# Patient Record
Sex: Female | Born: 1960 | Race: Black or African American | Hispanic: No | Marital: Single
Health system: Midwestern US, Community
[De-identification: ages and names within clinical notes are randomized; demographics above are authoritative.]

## PROBLEM LIST (undated history)

## (undated) DIAGNOSIS — D649 Anemia, unspecified: Secondary | ICD-10-CM

## (undated) DIAGNOSIS — J45909 Unspecified asthma, uncomplicated: Secondary | ICD-10-CM

## (undated) DIAGNOSIS — K8020 Calculus of gallbladder without cholecystitis without obstruction: Secondary | ICD-10-CM

## (undated) DIAGNOSIS — N289 Disorder of kidney and ureter, unspecified: Secondary | ICD-10-CM

## (undated) DIAGNOSIS — I10 Essential (primary) hypertension: Secondary | ICD-10-CM

## (undated) HISTORY — PX: ECTOPIC PREGNANCY SURGERY: SHX613

## (undated) HISTORY — DX: Unspecified asthma, uncomplicated: J45.909

---

## 2014-12-20 ENCOUNTER — Inpatient Hospital Stay
Admit: 2014-12-20 | Discharge: 2014-12-20 | Disposition: A | Discharge status: Home / Self Care | Payer: Self-pay | Attending: Emergency Medicine

## 2014-12-20 DIAGNOSIS — R300 Dysuria: Secondary | ICD-10-CM

## 2014-12-20 LAB — URINE MICROSCOPIC ONLY: RBC: UNDETERMINED /hpf (ref 0–5)

## 2014-12-20 LAB — URINALYSIS W/ RFLX MICROSCOPIC
Bilirubin: NEGATIVE
Glucose: NEGATIVE mg/dL
Ketone: NEGATIVE mg/dL
Nitrites: POSITIVE — AB
Protein: 30 mg/dL — AB
Specific gravity: 1.017 (ref 1.005–1.030)
Urobilinogen: 0.2 EU/dL (ref 0.2–1.0)
pH (UA): 8 (ref 5.0–8.0)

## 2014-12-20 MED ORDER — AMOXICILLIN-CLAVULANATE 500 MG-125 MG TAB
500-125 mg | ORAL_TABLET | Freq: Two times a day (BID) | ORAL | 0 refills | Status: AC
Start: 2014-12-20 — End: 2014-12-25

## 2014-12-20 NOTE — ED Triage Notes (Signed)
Pt states I have a urinary tract infection.  Pt states she has had this her entire life.  Pt states she recently go antibiotics and pt states the antibiotics were not right.  Pt c/o frequent urination.

## 2014-12-20 NOTE — ED Notes (Signed)
Life Coach Interventions:       Life Coach Consult: ???RX assistance???   # of Life Coach Consult request within past 12 months:0  PCP Verified by CM: N/A  Current Support Network: Family  PCP/Family Physician referral and/or ER follow-up:  Y  Investment banker, corporate Assistance referral:   N  Plan discussed with Pt/Family/Caregiver:  Y  Miscellaneous referral: N    Memo of interventions:  Pharmacist, community conducted to assist PT with ???RX assistance???Patient resides in [place]. PT provided with Good RX discount coupon for  amoxicillin-clavulanate (AUGMENTIN) 500-125 mg  10 tabs @ $12.23 ([facility])

## 2014-12-20 NOTE — ED Provider Notes (Signed)
HPI Comments: 54 year old female to the ED with C/O urinary frequency, dysuria, and foul smelling urine times several days.  States she frequently gets UTIs and thinks she has another.  States that she typically takes PCN for them with relief.  Denies any other complaints today.     The history is provided by the patient.        History reviewed. No pertinent past medical history.    History reviewed. No pertinent past surgical history.      History reviewed. No pertinent family history.    Social History     Social History   ??? Marital status: SINGLE     Spouse name: N/A   ??? Number of children: N/A   ??? Years of education: N/A     Occupational History   ??? Not on file.     Social History Main Topics   ??? Smoking status: Never Smoker   ??? Smokeless tobacco: Not on file   ??? Alcohol use No   ??? Drug use: No   ??? Sexual activity: Not on file     Other Topics Concern   ??? Not on file     Social History Narrative   ??? No narrative on file         ALLERGIES: Review of patient's allergies indicates no known allergies.    Review of Systems   Constitutional: Negative for chills and fever.   HENT: Negative.    Respiratory: Negative for chest tightness, shortness of breath and wheezing.    Cardiovascular: Negative for chest pain and palpitations.   Gastrointestinal: Negative for abdominal pain, diarrhea, nausea and vomiting.   Genitourinary: Positive for dysuria and frequency. Negative for flank pain.   Musculoskeletal: Negative.    Skin: Negative.    Neurological: Negative.        Vitals:    12/20/14 1113   BP: 142/87   Pulse: 64   Resp: 16   Temp: 97.6 ??F (36.4 ??C)   SpO2: 99%   Weight: 64 kg (141 lb)   Height:  (1.626 m)            Physical Exam   Constitutional: She is oriented to person, place, and time. She appears well-developed and well-nourished. No distress.   HENT:   Head: Normocephalic and atraumatic.   Eyes: EOM are normal. Pupils are equal, round, and reactive to light.    Neck: Neck supple. No tracheal deviation present.   Cardiovascular: Normal rate, regular rhythm and normal heart sounds.  Exam reveals no gallop and no friction rub.    No murmur heard.  Pulmonary/Chest: Effort normal and breath sounds normal. No respiratory distress. She has no wheezes. She has no rales.   Abdominal: Soft. Bowel sounds are normal. She exhibits no distension and no mass. There is no tenderness. There is no rebound and no guarding.   No CVAT   Lymphadenopathy:     She has no cervical adenopathy.   Neurological: She is alert and oriented to person, place, and time. No cranial nerve deficit.   Skin: Skin is dry. No rash noted. She is not diaphoretic. No erythema. No pallor.   Psychiatric: She has a normal mood and affect. Her behavior is normal.   Nursing note and vitals reviewed.       MDM  Number of Diagnoses or Management Options  Diagnosis management comments: Called lab to find out about lab results.  They state no order, despite order has  been in for 1 hour.  Reordered again.  Gifford Shave, PA 12:39 PM    Patient does not want to wait for lab to run her UA.  Plan to discharge with Augmentin, give her urology andPCM follow up. SHe agrees with the plan.  Gifford Shave, PA 12:44 PM           Amount and/or Complexity of Data Reviewed  Clinical lab tests: ordered    Risk of Complications, Morbidity, and/or Mortality  Presenting problems: low  Diagnostic procedures: low  Management options: low    Patient Progress  Patient progress: stable    ED Course       Procedures

## 2014-12-20 NOTE — ED Notes (Signed)
I have reviewed discharge instructions with the patient.  The patient verbalized understanding.

## 2015-03-25 ENCOUNTER — Inpatient Hospital Stay
Admit: 2015-03-25 | Discharge: 2015-03-25 | Disposition: A | Discharge status: Home / Self Care | Payer: Self-pay | Attending: Emergency Medicine

## 2015-03-25 DIAGNOSIS — N3000 Acute cystitis without hematuria: Secondary | ICD-10-CM

## 2015-03-25 LAB — URINALYSIS W/ RFLX MICROSCOPIC
Bilirubin: NEGATIVE
Glucose: NEGATIVE mg/dL
Ketone: NEGATIVE mg/dL
Nitrites: POSITIVE — AB
Protein: NEGATIVE mg/dL
Specific gravity: 1.014 (ref 1.005–1.030)
Urobilinogen: 0.2 EU/dL (ref 0.2–1.0)
pH (UA): 6 (ref 5.0–8.0)

## 2015-03-25 LAB — POC CHEM8
Anion gap, POC: 19 (ref 10–20)
BUN, POC: 15 MG/DL (ref 7–18)
CO2, POC: 27 MMOL/L — ABNORMAL HIGH (ref 19–24)
Calcium, ionized (POC): 1.16 MMOL/L (ref 1.12–1.32)
Chloride, POC: 104 MMOL/L (ref 100–108)
Creatinine, POC: 0.8 MG/DL (ref 0.6–1.3)
GFRAA, POC: >60 mL/min/{1.73_m2} (ref >60)
GFRNA, POC: >60 mL/min/{1.73_m2} (ref >60)
Glucose, POC: 86 MG/DL (ref 74–106)
Hematocrit, POC: 42 % (ref 36–49)
Hemoglobin, POC: 14.3 G/DL (ref 12–16)
Potassium, POC: 4 MMOL/L (ref 3.5–5.5)
Sodium, POC: 145 MMOL/L (ref 136–145)

## 2015-03-25 LAB — URINE MICROSCOPIC ONLY
RBC: 1 /hpf (ref 0–5)
WBC: 3 /hpf (ref 0–4)

## 2015-03-25 LAB — WET PREP

## 2015-03-25 MED ORDER — CIPROFLOXACIN 500 MG TAB
500 mg | ORAL_TABLET | Freq: Two times a day (BID) | ORAL | 0 refills | Status: AC
Start: 2015-03-25 — End: 2015-03-30

## 2015-03-25 NOTE — ED Triage Notes (Signed)
Pt c/o vaginal pain during sex, bl le and ue swelling, pt denies vaginal odor or discharge, no s/s of cardiac or respiratory distress, pt ambulatory strong steady gait
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
## 2015-03-25 NOTE — ED Notes (Signed)
Discharge instructions discussed with patient. Patient verbalizes understanding

## 2015-03-27 LAB — CULTURE, URINE: Culture result:: >100000

## 2015-03-27 LAB — CHLAMYDIA/NEISSERIA AMPLIFICATION
Chlamydia amplification: NEGATIVE
N. gonorrhoeae amplification: NEGATIVE

## 2015-07-05 ENCOUNTER — Emergency Department: Admit: 2015-07-05 | Payer: Self-pay | Primary: Student in an Organized Health Care Education/Training Program

## 2015-07-05 ENCOUNTER — Inpatient Hospital Stay
Admit: 2015-07-05 | Discharge: 2015-07-05 | Disposition: A | Discharge status: Home / Self Care | Payer: Self-pay | Attending: Emergency Medicine

## 2015-07-05 DIAGNOSIS — I10 Essential (primary) hypertension: Secondary | ICD-10-CM

## 2015-07-05 LAB — CBC WITH AUTOMATED DIFF
ABS. BASOPHILS: 0.1 10*3/uL (ref 0.0–0.1)
ABS. EOSINOPHILS: 0.2 10*3/uL (ref 0.0–0.4)
ABS. LYMPHOCYTES: 1.9 10*3/uL (ref 0.9–3.6)
ABS. MONOCYTES: 0.3 10*3/uL (ref 0.05–1.2)
ABS. NEUTROPHILS: 3 10*3/uL (ref 1.8–8.0)
BASOPHILS: 1 % (ref 0–2)
EOSINOPHILS: 3 % (ref 0–5)
HCT: 38.4 % (ref 35.0–45.0)
HGB: 12.7 g/dL (ref 12.0–16.0)
LYMPHOCYTES: 35 % (ref 21–52)
MCH: 27.1 PG (ref 24.0–34.0)
MCHC: 33.1 g/dL (ref 31.0–37.0)
MCV: 81.9 FL (ref 74.0–97.0)
MONOCYTES: 5 % (ref 3–10)
MPV: 10.6 FL (ref 9.2–11.8)
NEUTROPHILS: 56 % (ref 40–73)
PLATELET: 249 10*3/uL (ref 135–420)
RBC: 4.69 M/uL (ref 4.20–5.30)
RDW: 14.4 % (ref 11.6–14.5)
WBC: 5.4 10*3/uL (ref 4.6–13.2)

## 2015-07-05 LAB — URINE MICROSCOPIC ONLY
RBC: 2 /hpf (ref 0–5)
WBC: 0 /hpf (ref 0–4)

## 2015-07-05 LAB — URINALYSIS W/ RFLX MICROSCOPIC
Bilirubin: NEGATIVE
Glucose: NEGATIVE mg/dL
Leukocyte Esterase: NEGATIVE
Nitrites: NEGATIVE
Protein: NEGATIVE mg/dL
Specific gravity: 1.029 (ref 1.005–1.030)
Urobilinogen: 1 EU/dL (ref 0.2–1.0)
pH (UA): 5 (ref 5.0–8.0)

## 2015-07-05 LAB — METABOLIC PANEL, COMPREHENSIVE
A-G Ratio: 0.9 (ref 0.8–1.7)
ALT (SGPT): 27 U/L (ref 13–56)
AST (SGOT): 19 U/L (ref 15–37)
Albumin: 3.7 g/dL (ref 3.4–5.0)
Alk. phosphatase: 84 U/L (ref 45–117)
Anion gap: 4 mmol/L (ref 3.0–18)
BUN/Creatinine ratio: 20 (ref 12–20)
BUN: 17 MG/DL (ref 7.0–18)
Bilirubin, total: 0.3 MG/DL (ref 0.2–1.0)
CO2: 28 mmol/L (ref 21–32)
Calcium: 8.7 MG/DL (ref 8.5–10.1)
Chloride: 109 mmol/L — ABNORMAL HIGH (ref 100–108)
Creatinine: 0.85 MG/DL (ref 0.6–1.3)
GFR est AA: >60 mL/min/{1.73_m2} (ref >60)
GFR est non-AA: >60 mL/min/{1.73_m2} (ref >60)
Globulin: 4 g/dL (ref 2.0–4.0)
Glucose: 90 mg/dL (ref 74–99)
Potassium: 3.5 mmol/L (ref 3.5–5.5)
Protein, total: 7.7 g/dL (ref 6.4–8.2)
Sodium: 141 mmol/L (ref 136–145)

## 2015-07-05 LAB — CARDIAC PANEL,(CK, CKMB & TROPONIN)
CK - MB: <1 ng/ml (ref <3.6)
CK: 167 U/L (ref 26–192)
Troponin-I, QT: <0.02 NG/ML (ref 0.0–0.045)

## 2015-07-05 LAB — LIPASE: Lipase: 99 U/L (ref 73–393)

## 2015-07-05 LAB — MAGNESIUM: Magnesium: 2.2 mg/dL (ref 1.6–2.6)

## 2015-07-05 MED ORDER — ONDANSETRON 8 MG TAB, RAPID DISSOLVE
8 mg | ORAL | Status: DC
Start: 2015-07-05 — End: 2015-07-05

## 2015-07-05 MED ORDER — METOCLOPRAMIDE 5 MG/ML IJ SOLN
5 mg/mL | INTRAMUSCULAR | Status: DC
Start: 2015-07-05 — End: 2015-07-05

## 2015-07-05 MED ORDER — PANTOPRAZOLE 40 MG TAB, DELAYED RELEASE
40 mg | ORAL_TABLET | Freq: Every day | ORAL | 0 refills | Status: AC
Start: 2015-07-05 — End: 2015-07-25

## 2015-07-05 MED ORDER — PANTOPRAZOLE 40 MG TAB, DELAYED RELEASE
40 mg | ORAL | Status: DC
Start: 2015-07-05 — End: 2015-07-05

## 2015-07-05 MED ORDER — ONDANSETRON HCL 4 MG TAB
4 mg | ORAL_TABLET | Freq: Three times a day (TID) | ORAL | 0 refills | Status: AC | PRN
Start: 2015-07-05 — End: ?

## 2015-07-05 MED ORDER — KETOROLAC TROMETHAMINE 60 MG/2 ML IM
60 mg/2 mL | INTRAMUSCULAR | Status: DC
Start: 2015-07-05 — End: 2015-07-05

## 2015-07-05 MED ORDER — DICYCLOMINE 10 MG CAP
10 mg | ORAL | Status: DC
Start: 2015-07-05 — End: 2015-07-05

## 2015-07-05 MED ORDER — HYDROCHLOROTHIAZIDE 25 MG TAB
25 mg | ORAL_TABLET | Freq: Every day | ORAL | 0 refills | Status: AC
Start: 2015-07-05 — End: 2015-08-04

## 2015-07-05 MED ORDER — DIPHENHYDRAMINE HCL 50 MG/ML IJ SOLN
50 mg/mL | Freq: Once | INTRAMUSCULAR | Status: DC
Start: 2015-07-05 — End: 2015-07-05

## 2015-07-05 MED ORDER — KETOROLAC TROMETHAMINE 30 MG/ML INJECTION
30 mg/mL (1 mL) | INTRAMUSCULAR | Status: DC
Start: 2015-07-05 — End: 2015-07-05

## 2015-07-05 MED FILL — KETOROLAC TROMETHAMINE 60 MG/2 ML IM: 60 mg/2 mL | INTRAMUSCULAR | Qty: 2

## 2015-07-05 MED FILL — DIPHENHYDRAMINE HCL 50 MG/ML IJ SOLN: 50 mg/mL | INTRAMUSCULAR | Qty: 1

## 2015-07-05 NOTE — ED Triage Notes (Signed)
Pt, c/o on & off headache for  5 days,  Abdominal pain  With  Nausea,  Vaginal itching  For  For a week

## 2015-07-05 NOTE — ED Notes (Signed)
I have reviewed discharge instructions with the patient.  The patient verbalized understanding.

## 2015-07-05 NOTE — ED Provider Notes (Signed)
HPI Comments: 4:49 PM Abigail Braun is a 55 y.o. female with a h/o Pelvic surgery presents to the ED with c/o dizziness onset 5 days ago. Pt states that she does not use blood pressure medication given that this is the second time her blood pressure has been elevated. Pt reports blurry vision, abd pain, HA, nausea, and dizziness. Pt denies fever, vomiting, diarrhea, chest pain, or cough. All other sx denied. No other complaints at this time.     PCP-PROVIDER UNKNOWN      Patient is a 55 y.o. female presenting with abdominal pain, nausea, headaches, and vaginal itching. The history is provided by the patient.   Abdominal Pain    Associated symptoms include nausea and headaches. Pertinent negatives include no fever, no diarrhea, no vomiting, no dysuria and no chest pain.   Nausea    Associated symptoms include abdominal pain, headaches and headaches. Pertinent negatives include no chills, no fever, no diarrhea and no cough.   Headache   Associated symptoms include abdominal pain and headaches. Pertinent negatives include no chest pain and no shortness of breath.   Vaginal Itching    Pertinent negatives include no fever.        History reviewed. No pertinent past medical history.    History reviewed. No pertinent surgical history.      History reviewed. No pertinent family history.    Social History     Social History   ??? Marital status: SINGLE     Spouse name: N/A   ??? Number of children: N/A   ??? Years of education: N/A     Occupational History   ??? Not on file.     Social History Main Topics   ??? Smoking status: Never Smoker   ??? Smokeless tobacco: Not on file   ??? Alcohol use No   ??? Drug use: No   ??? Sexual activity: Not on file     Other Topics Concern   ??? Not on file     Social History Narrative         ALLERGIES: Review of patient's allergies indicates no known allergies.    Review of Systems   Constitutional: Negative for chills and fever.   HENT: Negative for sore throat.    Eyes: Negative for redness.    Respiratory: Negative for cough, shortness of breath and wheezing.    Cardiovascular: Negative for chest pain.   Gastrointestinal: Positive for abdominal pain and nausea. Negative for diarrhea and vomiting.   Genitourinary: Negative for dysuria.   Musculoskeletal: Negative for neck stiffness.   Skin: Negative for pallor.   Neurological: Positive for headaches.   Hematological: Does not bruise/bleed easily.   All other systems reviewed and are negative.      Vitals:    07/05/15 1254   BP: (!) 139/93   Pulse: 80   Resp: 18   Temp: 98.4 ??F (36.9 ??C)   SpO2: 98%   Weight: 63.5 kg (140 lb)   Height: '5\' 4"'  (1.626 m)            Physical Exam   Constitutional: She is oriented to person, place, and time. She appears well-developed and well-nourished. No distress.   HENT:   Head: Normocephalic and atraumatic.   Eyes: EOM are normal. Pupils are equal, round, and reactive to light.   No nystagmus   Neck: No JVD present. No tracheal deviation present. No thyromegaly present.   Cardiovascular: Normal rate, regular rhythm, normal heart sounds and  intact distal pulses.  Exam reveals no gallop and no friction rub.    No murmur heard.  Pulmonary/Chest: Effort normal and breath sounds normal. No stridor. No respiratory distress. She has no wheezes. She has no rales. She exhibits no tenderness.   Abdominal: Soft. She exhibits no distension and no mass. There is no tenderness. There is no rebound and no guarding.   Musculoskeletal: She exhibits no edema or tenderness.   Lymphadenopathy:     She has no cervical adenopathy.   Neurological: She is alert and oriented to person, place, and time.   Negative Romberg.  No dysdiadochokinesis, past pointing, tremor.  Normal heel-shin.   Skin: Skin is warm and dry. No rash noted. She is not diaphoretic. No erythema. No pallor.   Psychiatric: She has a normal mood and affect. Her behavior is normal. Thought content normal.   Nursing note and vitals reviewed.       MDM  ED Course       Procedures            Recent Results (from the past 12 hour(s))   CBC WITH AUTOMATED DIFF    Collection Time: 07/05/15  1:05 PM   Result Value Ref Range    WBC 5.4 4.6 - 13.2 K/uL    RBC 4.69 4.20 - 5.30 M/uL    HGB 12.7 12.0 - 16.0 g/dL    HCT 38.4 35.0 - 45.0 %    MCV 81.9 74.0 - 97.0 FL    MCH 27.1 24.0 - 34.0 PG    MCHC 33.1 31.0 - 37.0 g/dL    RDW 14.4 11.6 - 14.5 %    PLATELET 249 135 - 420 K/uL    MPV 10.6 9.2 - 11.8 FL    NEUTROPHILS 56 40 - 73 %    LYMPHOCYTES 35 21 - 52 %    MONOCYTES 5 3 - 10 %    EOSINOPHILS 3 0 - 5 %    BASOPHILS 1 0 - 2 %    ABS. NEUTROPHILS 3.0 1.8 - 8.0 K/UL    ABS. LYMPHOCYTES 1.9 0.9 - 3.6 K/UL    ABS. MONOCYTES 0.3 0.05 - 1.2 K/UL    ABS. EOSINOPHILS 0.2 0.0 - 0.4 K/UL    ABS. BASOPHILS 0.1 0.0 - 0.1 K/UL    DF AUTOMATED     METABOLIC PANEL, COMPREHENSIVE    Collection Time: 07/05/15  1:05 PM   Result Value Ref Range    Sodium 141 136 - 145 mmol/L    Potassium 3.5 3.5 - 5.5 mmol/L    Chloride 109 (H) 100 - 108 mmol/L    CO2 28 21 - 32 mmol/L    Anion gap 4 3.0 - 18 mmol/L    Glucose 90 74 - 99 mg/dL    BUN 17 7.0 - 18 MG/DL    Creatinine 0.85 0.6 - 1.3 MG/DL    BUN/Creatinine ratio 20 12 - 20      GFR est AA >60 >60 ml/min/1.53m    GFR est non-AA >60 >60 ml/min/1.746m   Calcium 8.7 8.5 - 10.1 MG/DL    Bilirubin, total 0.3 0.2 - 1.0 MG/DL    ALT (SGPT) 27 13 - 56 U/L    AST (SGOT) 19 15 - 37 U/L    Alk. phosphatase 84 45 - 117 U/L    Protein, total 7.7 6.4 - 8.2 g/dL    Albumin 3.7 3.4 - 5.0 g/dL    Globulin 4.0 2.0 -  4.0 g/dL    A-G Ratio 0.9 0.8 - 1.7     LIPASE    Collection Time: 07/05/15  1:05 PM   Result Value Ref Range    Lipase 99 73 - 393 U/L   CARDIAC PANEL,(CK, CKMB & TROPONIN)    Collection Time: 07/05/15  1:05 PM   Result Value Ref Range    CK 167 26 - 192 U/L    CK - MB <1.0 <3.6 ng/ml    CK-MB Index Cannot be calulated 0.0 - 4.0 %    Troponin-I, Qt. <0.02 0.0 - 0.045 NG/ML   MAGNESIUM    Collection Time: 07/05/15  1:05 PM   Result Value Ref Range     Magnesium 2.2 1.6 - 2.6 mg/dL   URINALYSIS W/ RFLX MICROSCOPIC    Collection Time: 07/05/15  1:15 PM   Result Value Ref Range    Color DARK YELLOW      Appearance CLEAR      Specific gravity 1.029 1.005 - 1.030      pH (UA) 5.0 5.0 - 8.0      Protein NEGATIVE  NEG mg/dL    Glucose NEGATIVE  NEG mg/dL    Ketone TRACE (A) NEG mg/dL    Bilirubin NEGATIVE  NEG      Blood SMALL (A) NEG      Urobilinogen 1.0 0.2 - 1.0 EU/dL    Nitrites NEGATIVE  NEG      Leukocyte Esterase NEGATIVE  NEG     URINE MICROSCOPIC ONLY    Collection Time: 07/05/15  1:15 PM   Result Value Ref Range    WBC 0 to 3 0 - 4 /hpf    RBC 2 to 5 0 - 5 /hpf    Epithelial cells 3+ 0 - 5 /lpf    Bacteria 1+ (A) NEG /hpf     5:27 PM  Diagnosis:   1. Essential hypertension    2. Dizziness    3. Calculus of gallbladder without cholecystitis without obstruction          Disposition: home    Follow-up Information     Follow up With Details Comments [place] Schedule an appointment as soon as possible for a visit in 3 days  [place]    [facility] EMERGENCY DEPT  If symptoms worsen return immediately [place] [address]  [phone]          Patient's Medications   Start Taking    HYDROCHLOROTHIAZIDE (HYDRODIURIL) 25 MG TABLET    Take 1 Tab by mouth daily for 30 days.    ONDANSETRON HCL (ZOFRAN, AS HYDROCHLORIDE,) 4 MG TABLET    Take 2 Tabs by mouth every eight (8) hours as needed for Nausea.    PANTOPRAZOLE (PROTONIX) 40 MG TABLET    Take 1 Tab by mouth daily for 20 days.   Continue Taking    No medications on file   These Medications have changed    No medications on file   Stop Taking    No medications on file                   Scribe Attestation  David Martinique scribing for and in the presence of Eulis Foster, [place] 4:52 PM, 07/05/15.    Physician Attestation  I personally performed the services described in the documentation,  reviewed the documentation, as recorded by  the scribe in my presence, and it accurately and completely records my words and actions.    Eulis Foster, PA 4:52 PM 07/05/15        Signed by : David Martinique, Scribe, 07/05/15 at 4:52 PM

## 2015-07-06 LAB — EKG, 12 LEAD, INITIAL
Atrial Rate: 76 {beats}/min
Calculated P Axis: 35 degrees
Calculated R Axis: 41 degrees
Calculated T Axis: 81 degrees
Diagnosis: NORMAL
P-R Interval: 134 ms
Q-T Interval: 378 ms
QRS Duration: 66 ms
QTC Calculation (Bezet): 425 ms
Ventricular Rate: 76 {beats}/min

## 2016-06-24 ENCOUNTER — Emergency Department (HOSPITAL_COMMUNITY): Payer: Self-pay

## 2016-06-24 ENCOUNTER — Encounter (HOSPITAL_COMMUNITY): Payer: Self-pay | Admitting: Emergency Medicine

## 2016-06-24 ENCOUNTER — Emergency Department (HOSPITAL_COMMUNITY)
Admission: EM | Admit: 2016-06-24 | Discharge: 2016-06-24 | Disposition: A | Discharge status: Home / Self Care | Payer: Self-pay | Attending: Emergency Medicine | Admitting: Emergency Medicine

## 2016-06-24 DIAGNOSIS — R1011 Right upper quadrant pain: Secondary | ICD-10-CM

## 2016-06-24 DIAGNOSIS — N390 Urinary tract infection, site not specified: Secondary | ICD-10-CM | POA: Insufficient documentation

## 2016-06-24 DIAGNOSIS — I10 Essential (primary) hypertension: Secondary | ICD-10-CM | POA: Insufficient documentation

## 2016-06-24 DIAGNOSIS — Z79899 Other long term (current) drug therapy: Secondary | ICD-10-CM | POA: Insufficient documentation

## 2016-06-24 DIAGNOSIS — F17200 Nicotine dependence, unspecified, uncomplicated: Secondary | ICD-10-CM | POA: Insufficient documentation

## 2016-06-24 HISTORY — DX: Essential (primary) hypertension: I10

## 2016-06-24 HISTORY — DX: Calculus of gallbladder without cholecystitis without obstruction: K80.20

## 2016-06-24 HISTORY — DX: Disorder of kidney and ureter, unspecified: N28.9

## 2016-06-24 HISTORY — DX: Anemia, unspecified: D64.9

## 2016-06-24 LAB — URINALYSIS, ROUTINE W REFLEX MICROSCOPIC
Bilirubin Urine: NEGATIVE
GLUCOSE, UA: NEGATIVE mg/dL
KETONES UR: 5 mg/dL — AB
NITRITE: NEGATIVE
PROTEIN: NEGATIVE mg/dL
Specific Gravity, Urine: 1.026 (ref 1.005–1.030)
pH: 5 (ref 5.0–8.0)

## 2016-06-24 LAB — CBC
HEMATOCRIT: 38.8 % (ref 36.0–46.0)
HEMOGLOBIN: 12.6 g/dL (ref 12.0–15.0)
MCH: 26.9 pg (ref 26.0–34.0)
MCHC: 32.5 g/dL (ref 30.0–36.0)
MCV: 82.7 fL (ref 78.0–100.0)
Platelets: 232 10*3/uL (ref 150–400)
RBC: 4.69 MIL/uL (ref 3.87–5.11)
RDW: 14.9 % (ref 11.5–15.5)
WBC: 8.5 10*3/uL (ref 4.0–10.5)

## 2016-06-24 LAB — COMPREHENSIVE METABOLIC PANEL
ALK PHOS: 58 U/L (ref 38–126)
ALT: 16 U/L (ref 14–54)
ANION GAP: 9 (ref 5–15)
AST: 25 U/L (ref 15–41)
Albumin: 4 g/dL (ref 3.5–5.0)
BUN: 18 mg/dL (ref 6–20)
CALCIUM: 9.2 mg/dL (ref 8.9–10.3)
CO2: 24 mmol/L (ref 22–32)
Chloride: 109 mmol/L (ref 101–111)
Creatinine, Ser: 0.79 mg/dL (ref 0.44–1.00)
GFR calc non Af Amer: >60 mL/min (ref >60)
Glucose, Bld: 95 mg/dL (ref 65–99)
POTASSIUM: 3.6 mmol/L (ref 3.5–5.1)
Sodium: 142 mmol/L (ref 135–145)
TOTAL PROTEIN: 7.1 g/dL (ref 6.5–8.1)
Total Bilirubin: 0.6 mg/dL (ref 0.3–1.2)

## 2016-06-24 LAB — LIPASE, BLOOD: Lipase: 13 U/L (ref 11–51)

## 2016-06-24 MED ORDER — IOPAMIDOL (ISOVUE-300) INJECTION 61%
INTRAVENOUS | Status: AC
  Administered 2016-06-24: 80 mL
  Filled 2016-06-24: qty 100

## 2016-06-24 MED ORDER — ONDANSETRON HCL 4 MG/2ML IJ SOLN
4.0000 mg | Freq: Once | INTRAMUSCULAR | Status: AC
  Administered 2016-06-24: 4 mg via INTRAVENOUS
  Filled 2016-06-24: qty 2

## 2016-06-24 MED ORDER — DEXTROSE 5 % IV SOLN
1.0000 g | Freq: Once | INTRAVENOUS | Status: AC
  Administered 2016-06-24: 1 g via INTRAVENOUS
  Filled 2016-06-24: qty 10

## 2016-06-24 MED ORDER — FENTANYL CITRATE (PF) 100 MCG/2ML IJ SOLN
50.0000 ug | Freq: Once | INTRAMUSCULAR | Status: AC
  Administered 2016-06-24: 50 ug via INTRAVENOUS
  Filled 2016-06-24 (×2): qty 2

## 2016-06-24 MED ORDER — CEPHALEXIN 500 MG PO CAPS: 500.0000 mg | ORAL_CAPSULE | Freq: Four times a day (QID) | ORAL | 0 refills | Status: AC

## 2016-06-24 NOTE — Progress Notes (Signed)
Attempt PIV. Pt. Reluctant.  Explained need for CT scan and medications. Alowed assessment with ultrasound.  Left arm assessed with ultrasound.  No suitable veins found for ct scan. Refusing any more attempts with right arm.

## 2016-06-24 NOTE — ED Provider Notes (Signed)
Patient signed out by Dr Manus Gunningancour that pt has uti, and u/s is pending. D/c to home post u/s.  u/s neg acute. Ct neg acute. uti on labs. Urinary symptoms present.   Will rx keflex uti.   Recheck pt comfortable, no acute distress.      Cathren LaineKevin Zuzanna Maroney, MD 06/24/16 [id number]

## 2016-06-24 NOTE — Progress Notes (Signed)
Present to do PIV.  Pt. Not in room.  At procedure.  Will return when Pt. Back in room.

## 2016-06-24 NOTE — ED Triage Notes (Signed)
Pt presents with RUQ abd pain since 3 days; pt dx with gall stones; denies n/v/d; pt also c/o "bladder pain" with increased urinary frequency, denies dysuria or hematuria

## 2016-06-24 NOTE — ED Notes (Signed)
Attempting to start IV on pt.  This RN attempted x1.  Ivory BroadKelly M., RN attempted x2.

## 2016-06-24 NOTE — ED Notes (Signed)
Patient transported to CT 

## 2016-06-24 NOTE — ED Provider Notes (Signed)
[facility]-EMERGENCY DEPT Provider Note   CSN: [id number] Arrival date & time: 06/24/16  0020     History   Chief Complaint Chief Complaint  Patient presents with  . Abdominal Pain  . bladder pain    HPI Taylor Stone is a 56 y.o. female.  Patient presents with right-sided abdominal pain worsening for the past 3 days. States this pain has been hurting her "ever since they told me had gallstones in [place]". She states this is about a month ago. She's had nausea but no vomiting. Pain is worse with eating. Denies any constipation or diarrhea. Also had developed "bladder pain" over the past several days with urinary frequency. Denies dysuria hematuria. Does have some urgency. Denies any chest pain or shortness of breath. Denies any fever. Denies any history of acid reflux or ulcers.   The history is provided by the patient.  Abdominal Pain   Associated symptoms include nausea, dysuria, frequency and hematuria. Pertinent negatives include fever, vomiting, constipation, headaches, arthralgias and myalgias.    Past Medical History:  Diagnosis Date  . Anemia   . Gall stone   . Hypertension   . Renal disorder    urinary infections    There are no active problems to display for this patient.   Past Surgical History:  Procedure Laterality Date  . ECTOPIC PREGNANCY SURGERY      OB History    No data available       Home Medications    Prior to Admission medications   Not on File    Family History History reviewed. No pertinent family history.  Social History Social History  Substance Use Topics  . Smoking status: Current Every Day Smoker  . Smokeless tobacco: Not on file  . Alcohol use No     Allergies   Patient has no known allergies.   Review of Systems Review of Systems  Constitutional: Positive for activity change and appetite change. Negative for fatigue and fever.  HENT: Negative for congestion and rhinorrhea.   Eyes: Negative for visual disturbance.    Respiratory: Negative for cough, chest tightness and shortness of breath.   Cardiovascular: Negative for chest pain.  Gastrointestinal: Positive for abdominal pain and nausea. Negative for constipation and vomiting.  Genitourinary: Positive for dysuria, frequency, hematuria and urgency. Negative for flank pain.  Musculoskeletal: Negative for arthralgias, back pain and myalgias.  Neurological: Negative for dizziness, weakness and headaches.  A complete 10 system review of systems was obtained and all systems are negative except as noted in the HPI and PMH.     Physical Exam Updated Vital Signs BP (!) 153/99 (BP Location: Left Arm)   Pulse (!) 57   Temp 97.5 F (36.4 C) (Oral)   Resp 18   SpO2 100%   Physical Exam  Constitutional: She is oriented to person, place, and time. She appears well-developed and well-nourished. No distress.  HENT:  Head: Normocephalic and atraumatic.  Mouth/Throat: Oropharynx is clear and moist. No oropharyngeal exudate.  Eyes: Conjunctivae and EOM are normal. Pupils are equal, round, and reactive to light.  Neck: Normal range of motion. Neck supple.  No meningismus.  Cardiovascular: Normal rate, regular rhythm, normal heart sounds and intact distal pulses.   No murmur heard. Pulmonary/Chest: Effort normal and breath sounds normal. No respiratory distress.  Abdominal: Soft. There is tenderness. There is no rebound and no guarding.   There is diffuse tenderness to palpation across her abdomen worse in the right lower quadrant. There is also  right upper quadrant epigastric tenderness and suprapubic tenderness. The majority of her pain seems to be in the right lower quadrant with voluntary guarding.  Musculoskeletal: Normal range of motion. She exhibits no edema or tenderness.  No CVAT  Neurological: She is alert and oriented to person, place, and time. No cranial nerve deficit. She exhibits normal muscle tone. Coordination normal.  No ataxia on finger to  nose bilaterally. No pronator drift. 5/5 strength throughout. CN 2-12 intact.Equal grip strength. Sensation intact.   Skin: Skin is warm.  Psychiatric: She has a normal mood and affect. Her behavior is normal.  Nursing note and vitals reviewed.    ED Treatments / Results  Labs (all labs ordered are listed, but only abnormal results are displayed) Labs Reviewed  URINALYSIS, ROUTINE W REFLEX MICROSCOPIC - Abnormal; Notable for the following:       Result Value   APPearance HAZY (*)    Hgb urine dipstick SMALL (*)    Ketones, ur 5 (*)    Leukocytes, UA LARGE (*)    Bacteria, UA RARE (*)    Squamous Epithelial / LPF 6-30 (*)    All other components within normal limits  URINE CULTURE  LIPASE, BLOOD  COMPREHENSIVE METABOLIC PANEL  CBC    EKG  EKG Interpretation None       Radiology No results found.  Procedures Procedures (including critical care time)  Medications Ordered in ED Medications  cefTRIAXone (ROCEPHIN) 1 g in dextrose 5 % 50 mL IVPB (not administered)  fentaNYL (SUBLIMAZE) injection 50 mcg (not administered)  ondansetron (ZOFRAN) injection 4 mg (not administered)     Initial Impression / Assessment and Plan / ED Course  I have reviewed the triage vital signs and the nursing notes.  Pertinent labs & imaging results that were available during my care of the patient were reviewed by me and considered in my medical decision making (see chart for details).     Patient's exam is more concerning for appendicitis given the right lower quadrant pain though she is worried about gallstones as well. Her labs obtained in triage are normal with normal LFTs and lipase. Urinalysis is positive for infection.  Labs are reassuring. No leukocytosis. LFTs and lipase normal. Ultrasound shows no gallstones. No signs of cholecystitis.  Lower abdominal pain there is more concerning for appendicitis and CT scan will be obtained. Patient is given Rocephin for UTI and urine  culture sent.  Care transferred to Dr. Denton Lank at shift change.   Final Clinical Impressions(s) / ED Diagnoses   Final diagnoses:  RUQ pain  Urinary tract infection without hematuria, site unspecified    New Prescriptions New Prescriptions   No medications on file     Glynn Octave, MD 06/24/16 [phone]

## 2016-06-24 NOTE — Discharge Instructions (Signed)
It was our pleasure to provide your ER care today - we hope that you feel better.  Take antibiotic as prescribed.  Drink plenty of fluids.  Take tylenol/motrin as need.   Follow up with primary care doctor in 1 week if symptoms fail to improve/resolve.  Return to ER if worse, severe pain, vomiting, fevers, other concern.  You were given pain medication in the ER - no driving for the next 4 hours.

## 2016-06-25 LAB — URINE CULTURE: Culture: <10000 — AB

## 2018-05-04 IMAGING — US US ABDOMEN LIMITED
1 series · 14 of 25 positions shown · non-contrast
Comparison: None in PACs

CLINICAL DATA: Right upper quadrant pain

EXAM:
US ABDOMEN LIMITED - RIGHT UPPER QUADRANT

[Series 1: us abdomen limited · 0.19mm/px · 14 of 38 slices shown]
[im 1/38]
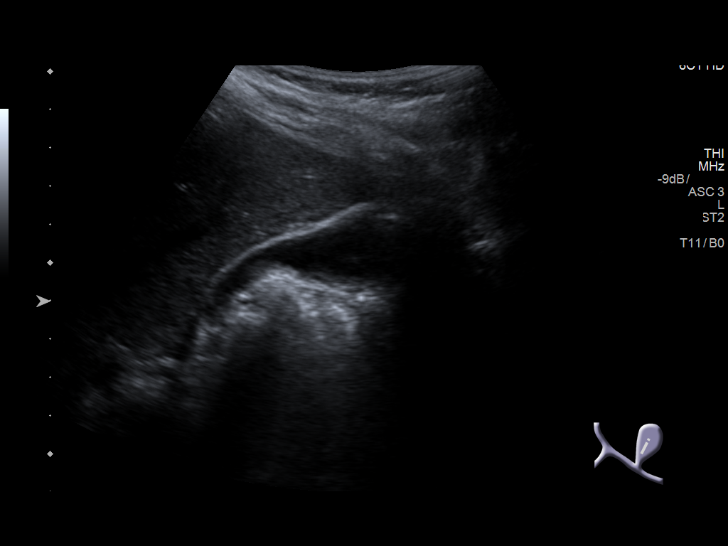
[im 4/38]
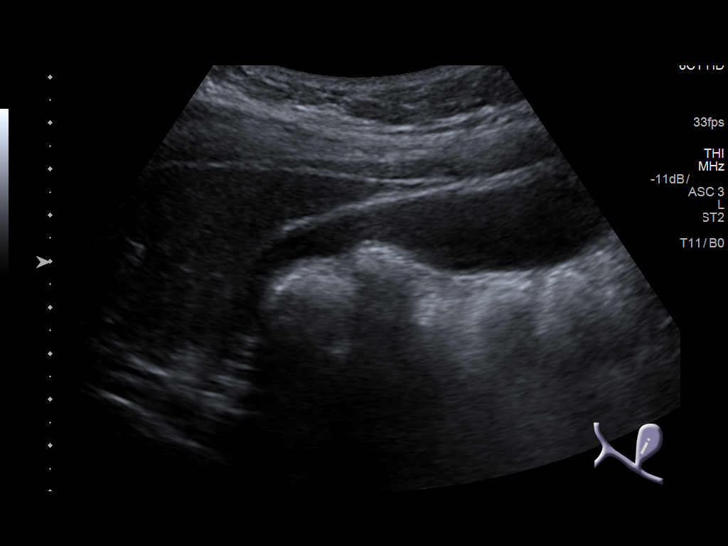
[im 7/38]
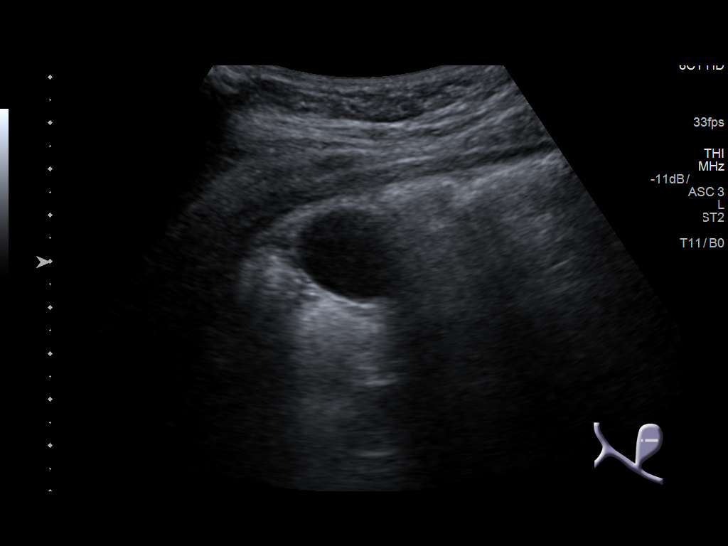
[im 10/38]
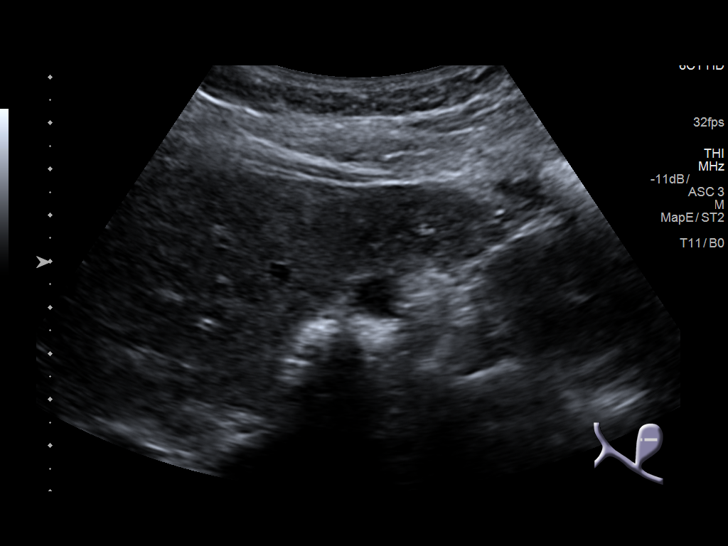
[im 13/38]
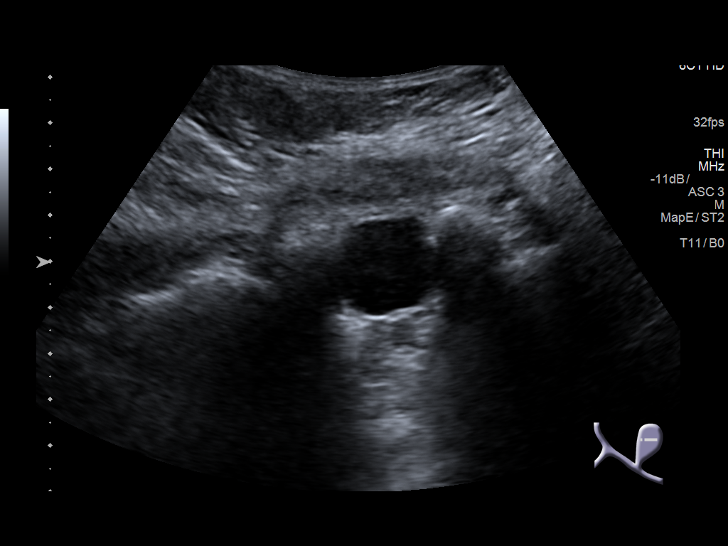
[im 14/38]
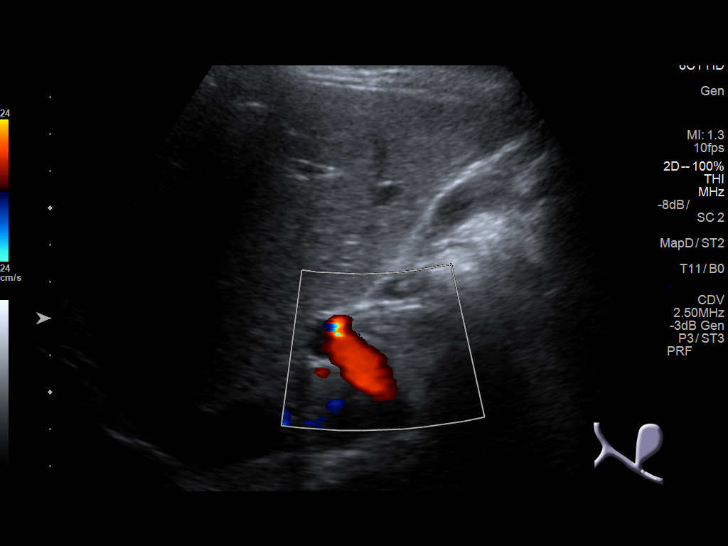
[im 17/38]
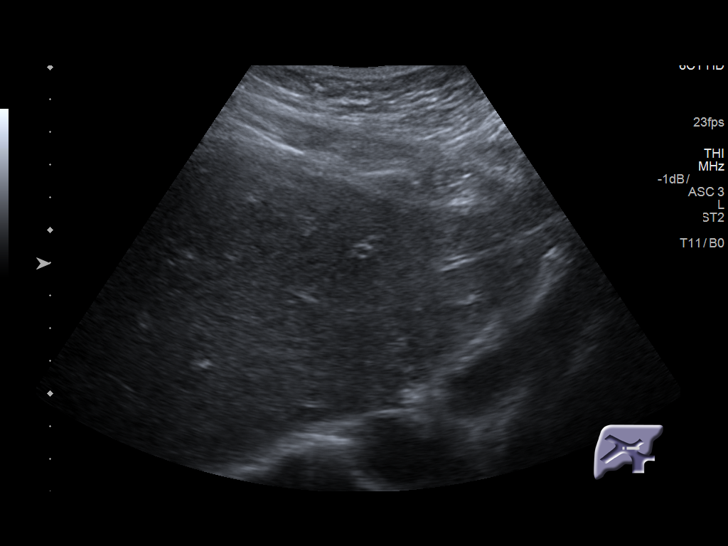
[im 21/38]
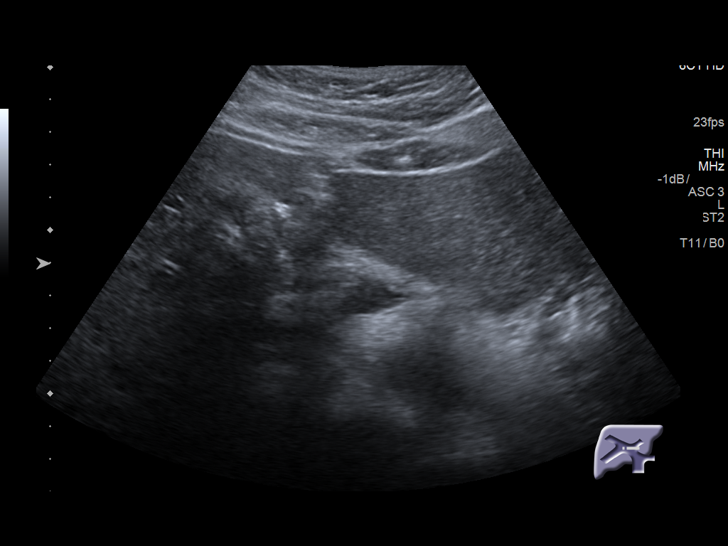
[im 24/38]
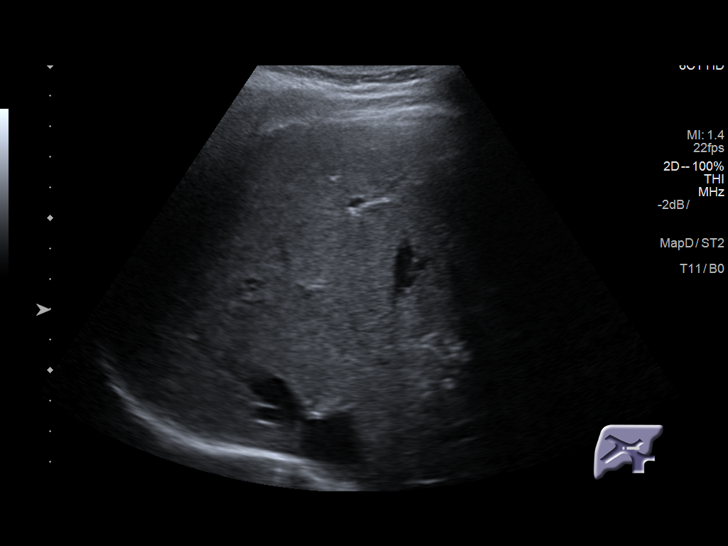
[im 25/38]
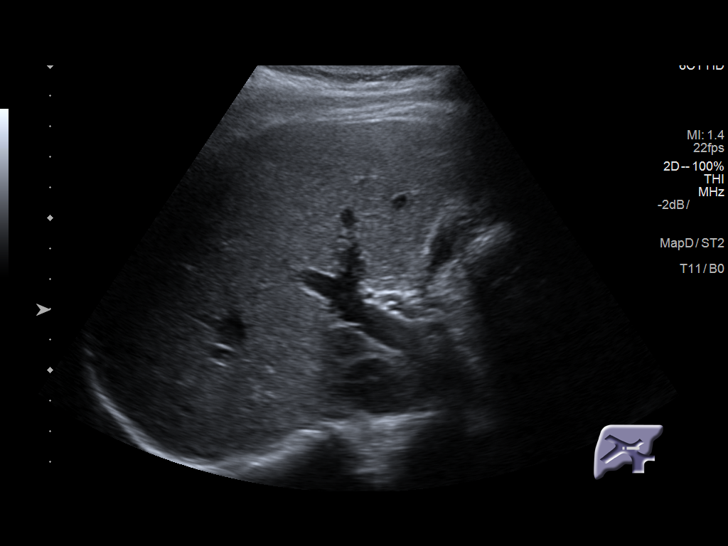
[im 28/38]
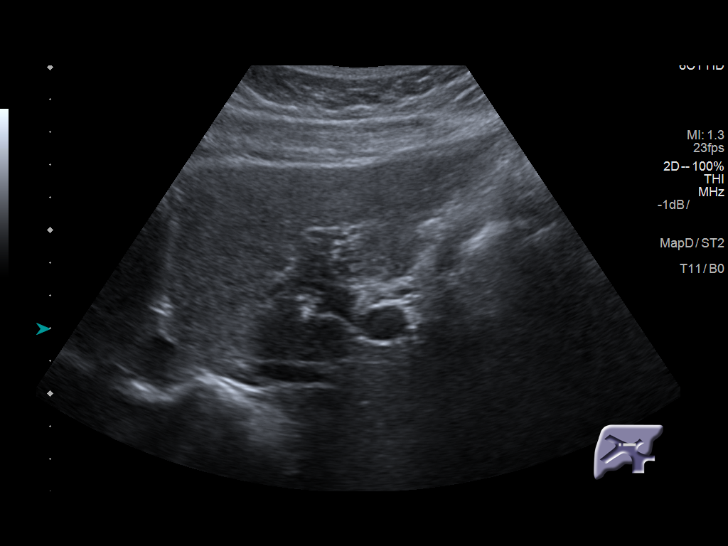
[im 31/38]
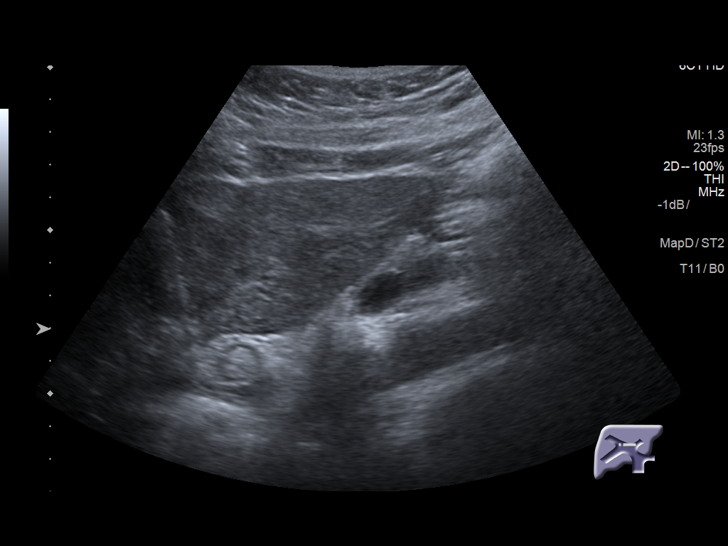
[im 34/38]
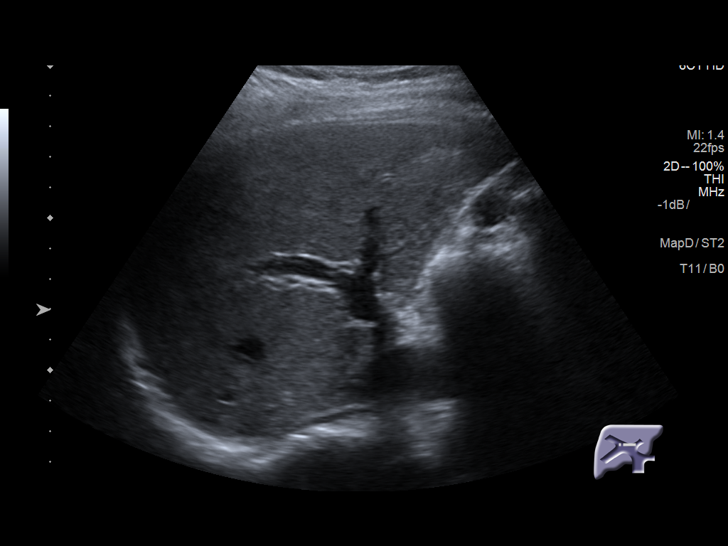
[im 38/38]
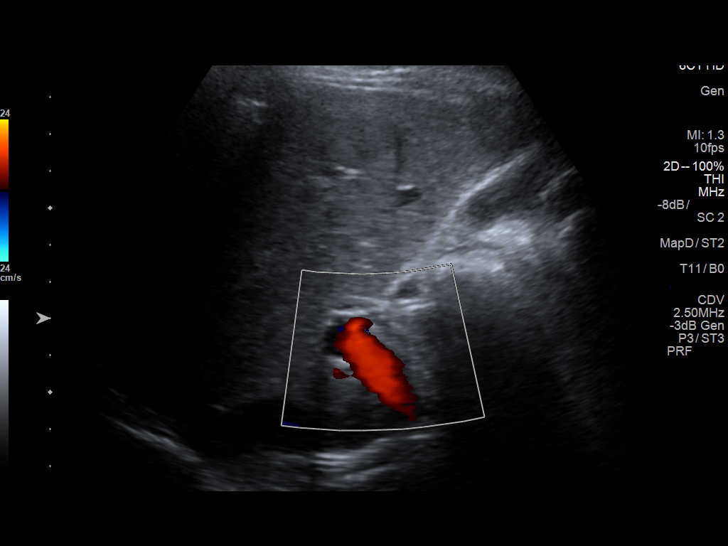

[14 of 25 positions shown; findings below may reference images not displayed]

FINDINGS: Gallbladder:

No gallstones or wall thickening visualized. No sonographic Murphy
sign noted by sonographer.

Common bile duct:

Diameter: 2.8 mm

Liver:

No focal lesion identified. Within normal limits in parenchymal
echogenicity.
IMPRESSION: No gallstones or sonographic evidence of acute cholecystitis. No
abnormality of the liver or common bile duct is demonstrated. If
there are clinical concerns of chronic cholecystitis, a nuclear
medicine hepatobiliary scan with gallbladder ejection fraction
determination may be useful.

## 2021-10-25 ENCOUNTER — Emergency Department (HOSPITAL_COMMUNITY)
Admission: EM | Admit: 2021-10-25 | Discharge: 2021-10-25 | Disposition: A | Discharge status: Home / Self Care | Payer: Self-pay | Attending: Emergency Medicine | Admitting: Emergency Medicine

## 2021-10-25 ENCOUNTER — Emergency Department (HOSPITAL_COMMUNITY): Payer: Self-pay

## 2021-10-25 ENCOUNTER — Encounter (HOSPITAL_COMMUNITY): Payer: Self-pay

## 2021-10-25 DIAGNOSIS — I10 Essential (primary) hypertension: Secondary | ICD-10-CM | POA: Insufficient documentation

## 2021-10-25 DIAGNOSIS — J45901 Unspecified asthma with (acute) exacerbation: Secondary | ICD-10-CM | POA: Insufficient documentation

## 2021-10-25 LAB — COMPREHENSIVE METABOLIC PANEL
ALT: 14 U/L (ref 0–44)
AST: 31 U/L (ref 15–41)
Albumin: 4 g/dL (ref 3.5–5.0)
Alkaline Phosphatase: 54 U/L (ref 38–126)
Anion gap: 9 (ref 5–15)
BUN: 16 mg/dL (ref 6–20)
CO2: 21 mmol/L — ABNORMAL LOW (ref 22–32)
Calcium: 9.4 mg/dL (ref 8.9–10.3)
Chloride: 109 mmol/L (ref 98–111)
Creatinine, Ser: 0.96 mg/dL (ref 0.44–1.00)
GFR, Estimated: >60 mL/min (ref >60)
Glucose, Bld: 96 mg/dL (ref 70–99)
Potassium: 4.3 mmol/L (ref 3.5–5.1)
Sodium: 139 mmol/L (ref 135–145)
Total Bilirubin: 0.8 mg/dL (ref 0.3–1.2)
Total Protein: 6.9 g/dL (ref 6.5–8.1)

## 2021-10-25 LAB — CBC WITH DIFFERENTIAL/PLATELET
Abs Immature Granulocytes: 0.02 10*3/uL (ref 0.00–0.07)
Basophils Absolute: 0.1 10*3/uL (ref 0.0–0.1)
Basophils Relative: 1 %
Eosinophils Absolute: 0 10*3/uL (ref 0.0–0.5)
Eosinophils Relative: 1 %
HCT: 38.5 % (ref 36.0–46.0)
Hemoglobin: 12.8 g/dL (ref 12.0–15.0)
Immature Granulocytes: 0 %
Lymphocytes Relative: 41 %
Lymphs Abs: 2.3 10*3/uL (ref 0.7–4.0)
MCH: 27.1 pg (ref 26.0–34.0)
MCHC: 33.2 g/dL (ref 30.0–36.0)
MCV: 81.6 fL (ref 80.0–100.0)
Monocytes Absolute: 0.4 10*3/uL (ref 0.1–1.0)
Monocytes Relative: 8 %
Neutro Abs: 2.8 10*3/uL (ref 1.7–7.7)
Neutrophils Relative %: 49 %
Platelets: 227 10*3/uL (ref 150–400)
RBC: 4.72 MIL/uL (ref 3.87–5.11)
RDW: 13.8 % (ref 11.5–15.5)
WBC: 5.7 10*3/uL (ref 4.0–10.5)
nRBC: 0 % (ref 0.0–0.2)

## 2021-10-25 LAB — TROPONIN I (HIGH SENSITIVITY): Troponin I (High Sensitivity): 6 ng/L (ref <18)

## 2021-10-25 MED ORDER — IBUPROFEN 400 MG PO TABS
600.0000 mg | ORAL_TABLET | Freq: Once | ORAL | Status: AC
  Administered 2021-10-25: 600 mg via ORAL
  Filled 2021-10-25: qty 1

## 2021-10-25 MED ORDER — ALBUTEROL SULFATE HFA 108 (90 BASE) MCG/ACT IN AERS: 1.0000 | INHALATION_SPRAY | Freq: Four times a day (QID) | RESPIRATORY_TRACT | 0 refills | Status: AC | PRN

## 2021-10-25 MED ORDER — IPRATROPIUM-ALBUTEROL 0.5-2.5 (3) MG/3ML IN SOLN
3.0000 mL | Freq: Once | RESPIRATORY_TRACT | Status: AC
  Administered 2021-10-25: 3 mL via RESPIRATORY_TRACT
  Filled 2021-10-25: qty 3
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
## 2021-10-25 NOTE — Discharge Instructions (Signed)
Follow-up with your primary care provider within 3 to 5 days for reevaluation of symptoms.  Note that it may be a good idea to be put on a daily treatment to decrease the likelihood of asthma exacerbation.  I have sent a prescription for albuterol inhaler to use as needed at home; this is a rescue inhaler.  Please do not hesitate to return to the emergency department if the worrisome signs and symptoms we discussed become apparent.

## 2021-10-25 NOTE — ED Notes (Signed)
Pt ate a sandwich and drank fluids. Tolerated well.
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
